# Patient Record
Sex: Male | Born: 1995 | Race: White | Hispanic: No | Marital: Single | Smoking: Never smoker
Health system: Southern US, Community
[De-identification: ages and names within clinical notes are randomized; demographics above are authoritative.]

---

## 2017-12-19 ENCOUNTER — Other Ambulatory Visit: Payer: Self-pay

## 2017-12-19 ENCOUNTER — Encounter (HOSPITAL_COMMUNITY): Payer: Self-pay | Admitting: Emergency Medicine

## 2017-12-19 ENCOUNTER — Emergency Department (HOSPITAL_COMMUNITY)
Admission: EM | Admit: 2017-12-19 | Discharge: 2017-12-20 | Disposition: A | Discharge status: Home / Self Care | Payer: BLUE CROSS/BLUE SHIELD | Attending: Emergency Medicine | Admitting: Emergency Medicine

## 2017-12-19 DIAGNOSIS — N132 Hydronephrosis with renal and ureteral calculous obstruction: Secondary | ICD-10-CM | POA: Insufficient documentation

## 2017-12-19 DIAGNOSIS — R109 Unspecified abdominal pain: Secondary | ICD-10-CM | POA: Diagnosis present

## 2017-12-19 DIAGNOSIS — R319 Hematuria, unspecified: Secondary | ICD-10-CM | POA: Diagnosis not present

## 2017-12-19 DIAGNOSIS — N201 Calculus of ureter: Secondary | ICD-10-CM

## 2017-12-19 NOTE — ED Triage Notes (Addendum)
Patient complaining of left flank pain. Patient states this started Wednesday. Patient is also having blood in urine. Patient has hx of kidney stones.

## 2017-12-20 ENCOUNTER — Emergency Department (HOSPITAL_COMMUNITY): Payer: BLUE CROSS/BLUE SHIELD

## 2017-12-20 ENCOUNTER — Encounter (HOSPITAL_COMMUNITY): Payer: Self-pay | Admitting: Radiology

## 2017-12-20 LAB — BASIC METABOLIC PANEL
Anion gap: 9 (ref 5–15)
BUN: 16 mg/dL (ref 6–20)
CALCIUM: 9.9 mg/dL (ref 8.9–10.3)
CO2: 28 mmol/L (ref 22–32)
Chloride: 105 mmol/L (ref 101–111)
Creatinine, Ser: 1.16 mg/dL (ref 0.61–1.24)
GFR calc Af Amer: >60 mL/min (ref >60)
GFR calc non Af Amer: >60 mL/min (ref >60)
Glucose, Bld: 104 mg/dL — ABNORMAL HIGH (ref 65–99)
Potassium: 4 mmol/L (ref 3.5–5.1)
Sodium: 142 mmol/L (ref 135–145)

## 2017-12-20 LAB — CBC WITH DIFFERENTIAL/PLATELET
Basophils Absolute: 0 10*3/uL (ref 0.0–0.1)
Basophils Relative: 0 %
Eosinophils Absolute: 0.1 10*3/uL (ref 0.0–0.7)
Eosinophils Relative: 1 %
HCT: 45.5 % (ref 39.0–52.0)
Hemoglobin: 15 g/dL (ref 13.0–17.0)
Lymphocytes Relative: 13 %
Lymphs Abs: 1.2 10*3/uL (ref 0.7–4.0)
MCH: 29.9 pg (ref 26.0–34.0)
MCHC: 33 g/dL (ref 30.0–36.0)
MCV: 90.8 fL (ref 78.0–100.0)
Monocytes Absolute: 1.1 10*3/uL — ABNORMAL HIGH (ref 0.1–1.0)
Monocytes Relative: 12 %
Neutro Abs: 6.7 10*3/uL (ref 1.7–7.7)
Neutrophils Relative %: 74 %
Platelets: 209 10*3/uL (ref 150–400)
RBC: 5.01 MIL/uL (ref 4.22–5.81)
RDW: 12.5 % (ref 11.5–15.5)
WBC: 9.1 10*3/uL (ref 4.0–10.5)

## 2017-12-20 LAB — URINALYSIS, ROUTINE W REFLEX MICROSCOPIC
Bacteria, UA: NONE SEEN
Bilirubin Urine: NEGATIVE
Glucose, UA: NEGATIVE mg/dL
Ketones, ur: 20 mg/dL — AB
Leukocytes, UA: NEGATIVE
Nitrite: NEGATIVE
Protein, ur: NEGATIVE mg/dL
RBC / HPF: >50 RBC/hpf — ABNORMAL HIGH (ref 0–5)
Specific Gravity, Urine: 1.026 (ref 1.005–1.030)
pH: 6 (ref 5.0–8.0)

## 2017-12-20 MED ORDER — ONDANSETRON HCL 4 MG/2ML IJ SOLN
4.0000 mg | Freq: Once | INTRAMUSCULAR | Status: AC
  Administered 2017-12-20: 4 mg via INTRAVENOUS
  Filled 2017-12-20: qty 2

## 2017-12-20 MED ORDER — OXYCODONE-ACETAMINOPHEN 5-325 MG PO TABS: 1.0000 | ORAL_TABLET | ORAL | 0 refills | Status: AC | PRN

## 2017-12-20 MED ORDER — ONDANSETRON 4 MG PO TBDP: 4.0000 mg | ORAL_TABLET | Freq: Three times a day (TID) | ORAL | 0 refills | Status: AC | PRN

## 2017-12-20 MED ORDER — HYDROMORPHONE HCL 1 MG/ML IJ SOLN
0.5000 mg | Freq: Once | INTRAMUSCULAR | Status: AC
  Administered 2017-12-20: 0.5 mg via INTRAVENOUS
  Filled 2017-12-20: qty 1

## 2017-12-20 MED ORDER — KETOROLAC TROMETHAMINE 30 MG/ML IJ SOLN
30.0000 mg | Freq: Once | INTRAMUSCULAR | Status: AC
  Administered 2017-12-20: 30 mg via INTRAVENOUS
  Filled 2017-12-20: qty 1

## 2017-12-20 MED ORDER — TAMSULOSIN HCL 0.4 MG PO CAPS: 0.4000 mg | ORAL_CAPSULE | Freq: Every day | ORAL | 0 refills | Status: AC

## 2017-12-20 NOTE — ED Provider Notes (Signed)
[facility]-EMERGENCY DEPT Provider Note   CSN: [id number] Arrival date & time: 12/19/17  2155     History   Chief Complaint Chief Complaint  Patient presents with  . Flank Pain    HPI Brian Barber is a 22 y.o. male.  The history is provided by the patient and medical records.  Flank Pain      22 year old male presenting to the ED with left flank pain.  Reports this began on Wednesday and has been progressively worsening.  States pain only to the left flank without significant radiation to the groin.  He does report some hematuria but denies any dysuria, difficulty urinating, or penile discharge.  No fever or chills.  Reports history of kidney stones in the past with similar symptoms.  Stones in the past have passed spontaneously without issue.  He did take a hydrocodone at home without any significant relief.  History reviewed. No pertinent past medical history.  There are no active problems to display for this patient.   History reviewed. No pertinent surgical history.      Home Medications    Prior to Admission medications   Not on File    Family History History reviewed. No pertinent family history.  Social History Social History   Tobacco Use  . Smoking status: Never Smoker  . Smokeless tobacco: Never Used  Substance Use Topics  . Alcohol use: Never    Frequency: Never  . Drug use: Never     Allergies   Patient has no known allergies.   Review of Systems Review of Systems  Genitourinary: Positive for flank pain and hematuria.  All other systems reviewed and are negative.    Physical Exam Updated Vital Signs BP 127/86 (BP Location: Left Arm)   Pulse (!) 53   Temp 98.3 F (36.8 C) (Oral)   Resp 18   Ht 5\' 9"  (1.753 m)   Wt 78.9 kg (174 lb)   SpO2 98%   BMI 25.70 kg/m   Physical Exam  Constitutional: He is oriented to person, place, and time. He appears well-developed and well-nourished.  HENT:  Head:  Normocephalic and atraumatic.  Mouth/Throat: Oropharynx is clear and moist.  Eyes: Pupils are equal, round, and reactive to light. Conjunctivae and EOM are normal.  Neck: Normal range of motion.  Cardiovascular: Normal rate, regular rhythm and normal heart sounds.  Pulmonary/Chest: Effort normal and breath sounds normal. No stridor. No respiratory distress.  Abdominal: Soft. Bowel sounds are normal. There is no tenderness. There is CVA tenderness (left). There is no rebound.  Musculoskeletal: Normal range of motion.  Neurological: He is alert and oriented to person, place, and time.  Skin: Skin is warm and dry.  Psychiatric: He has a normal mood and affect.  Nursing note and vitals reviewed.    ED Treatments / Results  Labs (all labs ordered are listed, but only abnormal results are displayed) Labs Reviewed  URINALYSIS, ROUTINE W REFLEX MICROSCOPIC - Abnormal; Notable for the following components:      Result Value   APPearance HAZY (*)    Hgb urine dipstick LARGE (*)    Ketones, ur 20 (*)    RBC / HPF >50 (*)    All other components within normal limits  CBC WITH DIFFERENTIAL/PLATELET - Abnormal; Notable for the following components:   Monocytes Absolute 1.1 (*)    All other components within normal limits  BASIC METABOLIC PANEL - Abnormal; Notable for the following components:  Glucose, Bld 104 (*)    All other components within normal limits    EKG None  Radiology Ct Renal Stone Study  Result Date: 12/20/2017 CLINICAL DATA:  Left abdominal LEFT flank pain for 5 days. Nausea, vomiting and hematuria. History of kidney stones. EXAM: CT ABDOMEN AND PELVIS WITHOUT CONTRAST TECHNIQUE: Multidetector CT imaging of the abdomen and pelvis was performed following the standard protocol without IV contrast. COMPARISON:  None. FINDINGS: LOWER CHEST: Lung bases are clear. The visualized heart size is normal. No pericardial effusion. HEPATOBILIARY: Normal. PANCREAS: Normal. SPLEEN:  Normal. ADRENALS/URINARY TRACT: Kidneys are orthotopic, demonstrating normal size and morphology. Mild LEFT hydroureteronephrosis with proximal ureter where a 4 mm calculus is present. Bilateral punctate nephrolithiasis in addition to 3 mm RIGHT upper pole, 2 mm LEFT upper pole nephrolithiasis. Limited assessment for renal masses by nonenhanced CT. Urinary bladder is decompressed distended and unremarkable. Normal adrenal glands. STOMACH/BOWEL: The stomach, small and large bowel are normal in course and caliber without inflammatory changes, sensitivity decreased by lack of enteric contrast. Small amount of small bowel feces compatible with chronic stasis. Mild amount of retained large bowel stool. Normal appendix. VASCULAR/LYMPHATIC: Aortoiliac vessels are normal in course and caliber. No lymphadenopathy by CT size criteria. REPRODUCTIVE: Normal. OTHER: Small volume free fluid in the pelvis, likely reactive. No intraperitoneal free air or focal fluid collections. MUSCULOSKELETAL: Non-acute.  Small fat containing umbilical hernia. IMPRESSION: 1. 4 mm proximal LEFT ureteral calculus resulting in mild obstructive uropathy. 2. Bilateral nephrolithiasis measuring to 3 mm. Electronically Signed   By: Awilda Metro M.D.   On: 12/20/2017 01:00    Procedures Procedures (including critical care time)  Medications Ordered in ED Medications  ketorolac (TORADOL) 30 MG/ML injection 30 mg (30 mg Intravenous Given 12/20/17 0109)  HYDROmorphone (DILAUDID) injection 0.5 mg (0.5 mg Intravenous Given 12/20/17 0112)  ondansetron (ZOFRAN) injection 4 mg (4 mg Intravenous Given 12/20/17 0107)     Initial Impression / Assessment and Plan / ED Course  I have reviewed the triage vital signs and the nursing notes.  Pertinent labs & imaging results that were available during my care of the patient were reviewed by me and considered in my medical decision making (see chart for details).  22 y.o. M here with left flank pain  for the past few days, worse today.   Has had some associated nausea, vomiting, and hematuria.  He is afebrile and nontoxic.  Left CVA tenderness, remainder of abdomen soft and benign.  Concern for stone, has history of same.  Urinalysis has been sent.  Will obtain basic labs, given pain and nausea medications.  CT renal study pending.  CT with 4mm proximal left ureteral calculus.  Patient feeling much improved after medications here.  VSS.  Feel he is stable for d/c.  Will send home with scripts for symptom control including flomax.  Given urine strainer to monitor for passage of stone.  He is established with urology, will follow-up with them in clinic.  He understands to return here for any new/acute changes.  Final Clinical Impressions(s) / ED Diagnoses   Final diagnoses:  Ureteral stone  Hematuria, unspecified type    ED Discharge Orders        Ordered    oxyCODONE-acetaminophen (PERCOCET) 5-325 MG tablet  Every 4 hours PRN     12/20/17 0212    ondansetron (ZOFRAN ODT) 4 MG disintegrating tablet  Every 8 hours PRN     12/20/17 0212    tamsulosin (FLOMAX) 0.4  MG CAPS capsule  Daily after supper     12/20/17 0212       Garlon HatchetSanders, Jie Stickels M, PA-C 12/20/17 0534    Paula LibraMolpus, John, MD 12/20/17 [phone]

## 2017-12-20 NOTE — Discharge Instructions (Signed)
Take the prescribed medication as directed. Follow-up with urology if any ongoing issues. Return to the ED for new or worsening symptoms-- unable to tolerate oral medications, high fever, cannot urinate, etc.

## 2017-12-20 NOTE — ED Notes (Signed)
Patient transported to CT 

## 2019-03-21 IMAGING — CT CT RENAL STONE PROTOCOL
2 of 4 series · 16 of 46 positions shown, 18 images · non-contrast
Comparison: None.

CLINICAL DATA: Left abdominal LEFT flank pain for 5 days. Nausea,
vomiting and hematuria. History of kidney stones.

EXAM:
CT ABDOMEN AND PELVIS WITHOUT CONTRAST
TECHNIQUE: Multidetector CT imaging of the abdomen and pelvis was performed
following the standard protocol without IV contrast.

[Series 2: axial st · axial · 0.70mm/px · z∈[-484,-49]mm · 13 of 97 slices shown, 15 images]
[im 5/97  soft-tissue]
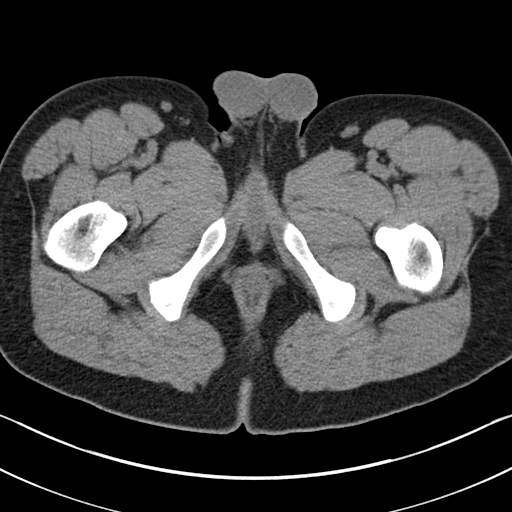
[im 5/97  bone]
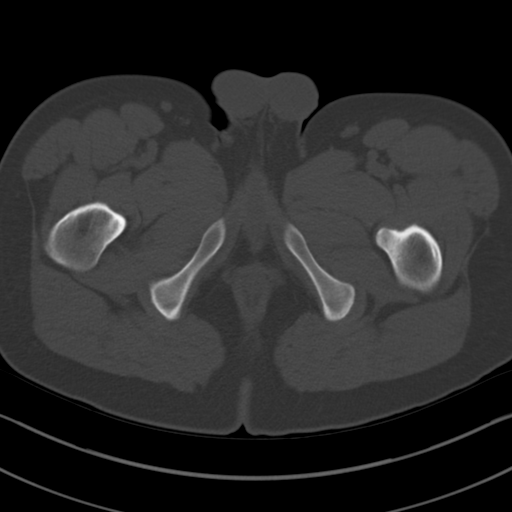
[im 15/97  soft-tissue]
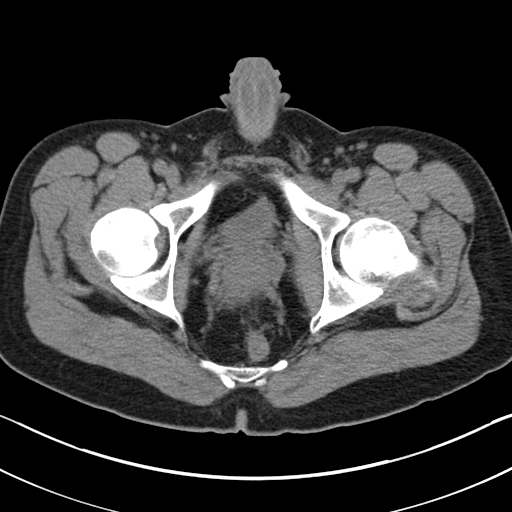
[im 20/97  soft-tissue]
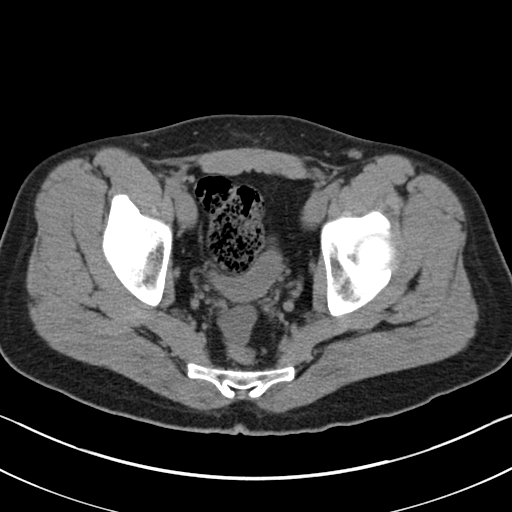
[im 29/97  soft-tissue]
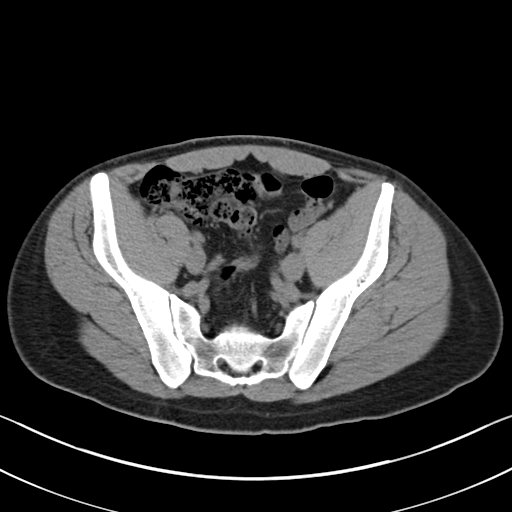
[im 34/97  soft-tissue]
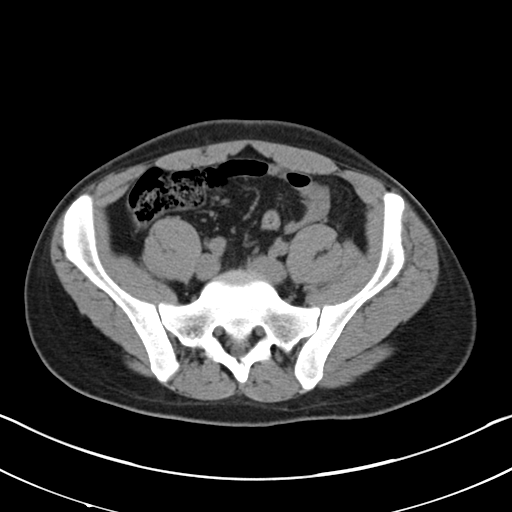
[im 44/97  soft-tissue]
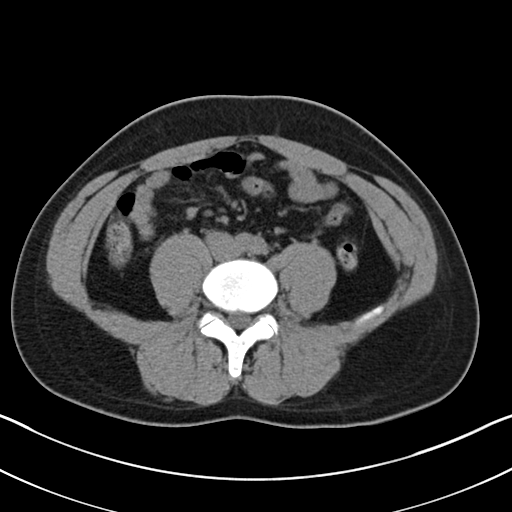
[im 49/97  soft-tissue]
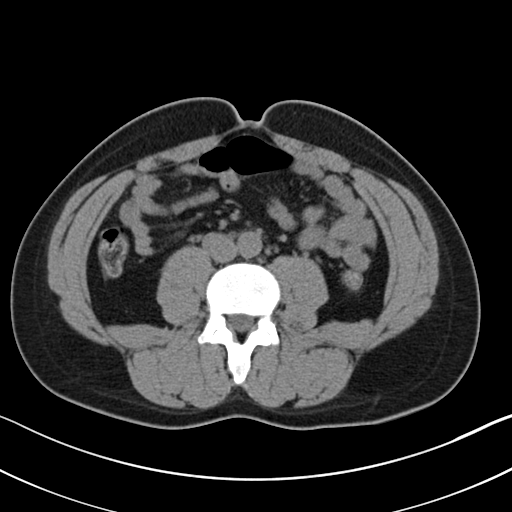
[im 53/97  soft-tissue]
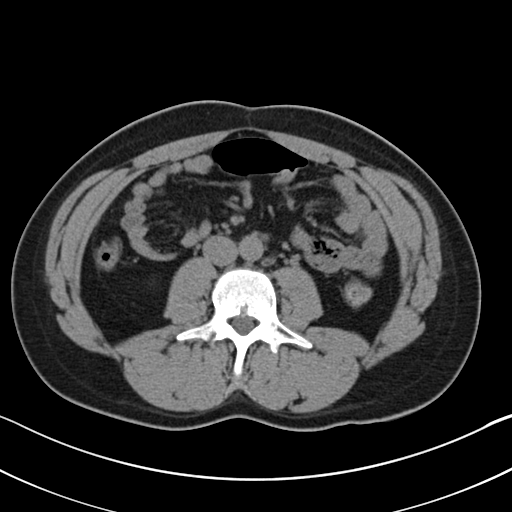
[im 63/97  soft-tissue]
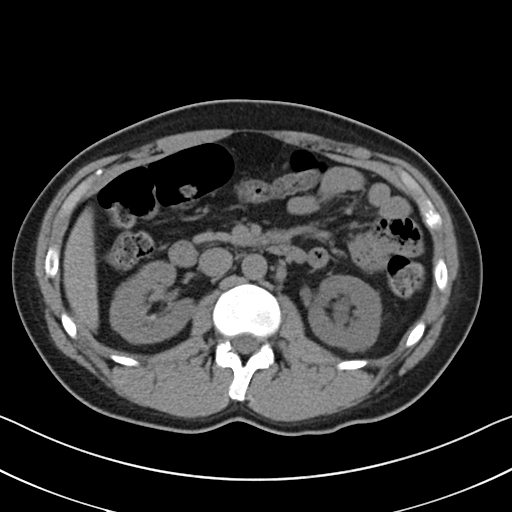
[im 63/97  bone]
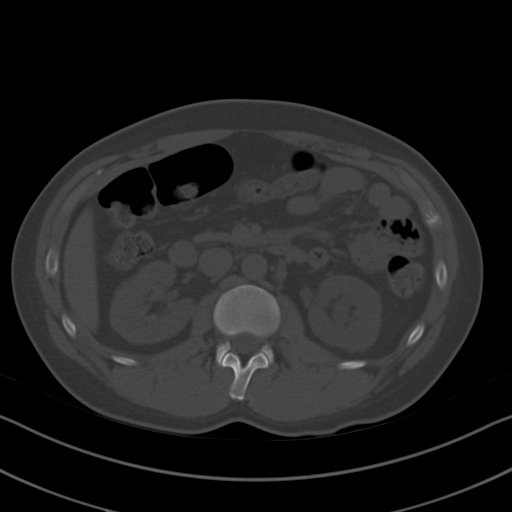
[im 68/97  soft-tissue]
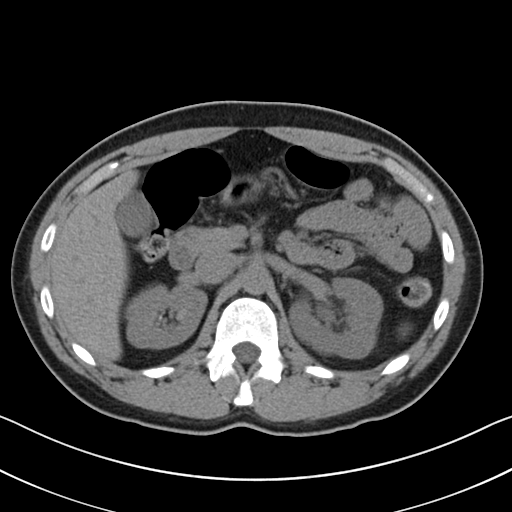
[im 77/97  soft-tissue]
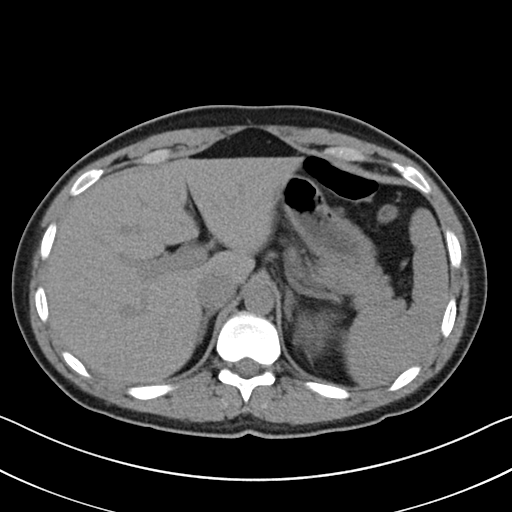
[im 82/97  soft-tissue]
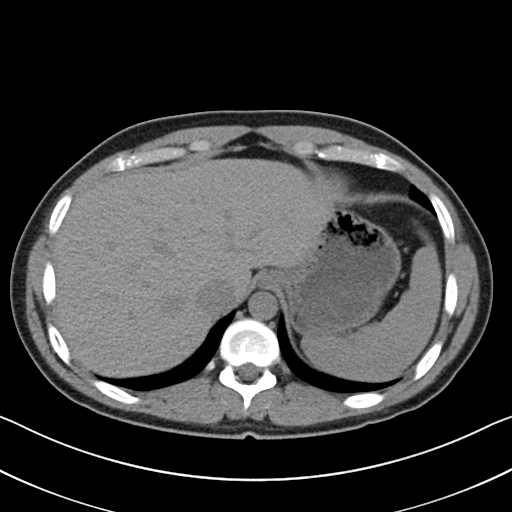
[im 92/97  soft-tissue]
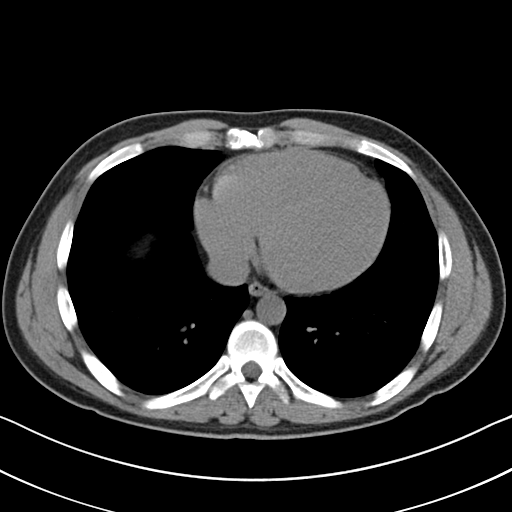

[Series 4: coronal · coronal · 0.89mm/px · 3 of 137 slices shown]
[im 46/137  soft-tissue]
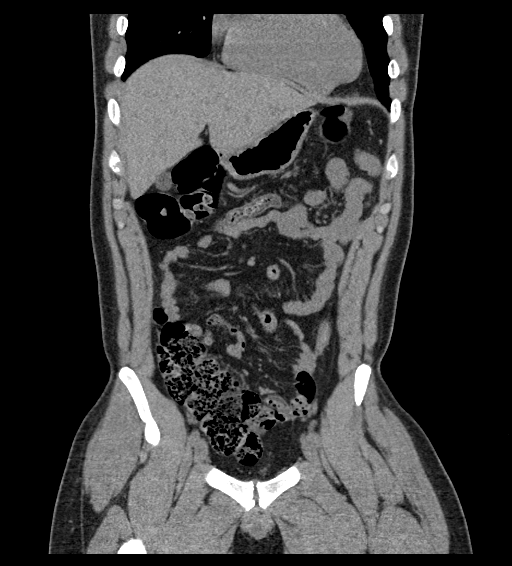
[im 61/137  soft-tissue]
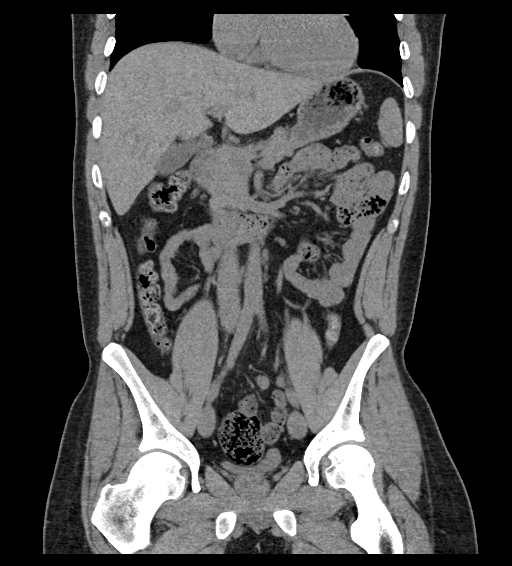
[im 76/137  soft-tissue]
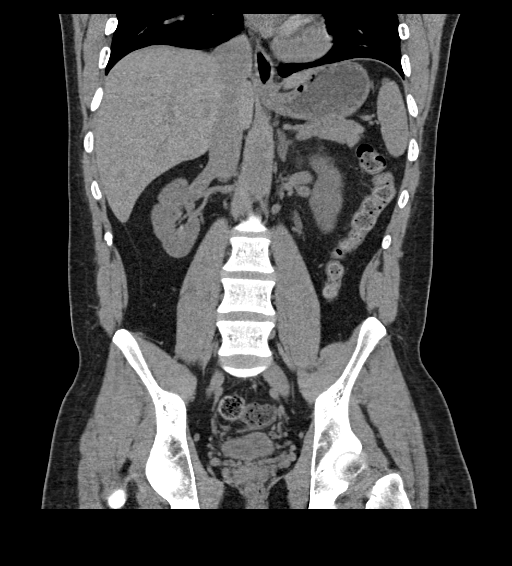

[16 of 46 positions shown; findings below may reference images not displayed]

FINDINGS: LOWER CHEST: Lung bases are clear. The visualized heart size is
normal. No pericardial effusion.

HEPATOBILIARY: Normal.

PANCREAS: Normal.

SPLEEN: Normal.

ADRENALS/URINARY TRACT: Kidneys are orthotopic, demonstrating normal
size and morphology. Mild LEFT hydroureteronephrosis with proximal
ureter where a 4 mm calculus is present. Bilateral punctate
nephrolithiasis in addition to 3 mm RIGHT upper pole, 2 mm LEFT
upper pole nephrolithiasis. Limited assessment for renal masses by
nonenhanced CT. Urinary bladder is decompressed distended and
unremarkable. Normal adrenal glands.

STOMACH/BOWEL: The stomach, small and large bowel are normal in
course and caliber without inflammatory changes, sensitivity
decreased by lack of enteric contrast. Small amount of small bowel
feces compatible with chronic stasis. Mild amount of retained large
bowel stool. Normal appendix.

VASCULAR/LYMPHATIC: Aortoiliac vessels are normal in course and
caliber. No lymphadenopathy by CT size criteria.

REPRODUCTIVE: Normal.

OTHER: Small volume free fluid in the pelvis, likely reactive. No
intraperitoneal free air or focal fluid collections.

MUSCULOSKELETAL: Non-acute.  Small fat containing umbilical hernia.
IMPRESSION: 1. 4 mm proximal LEFT ureteral calculus resulting in mild
obstructive uropathy.
2. Bilateral nephrolithiasis measuring to 3 mm.
